# Patient Record
Sex: Male | Born: 1955 | Race: White | Hispanic: No | Marital: Married
Health system: Southern US, Community
[De-identification: ages and names within clinical notes are randomized; demographics above are authoritative.]

---

## 2014-01-08 ENCOUNTER — Other Ambulatory Visit: Payer: Self-pay | Admitting: Specialist

## 2014-01-08 DIAGNOSIS — M549 Dorsalgia, unspecified: Secondary | ICD-10-CM

## 2014-01-08 DIAGNOSIS — M542 Cervicalgia: Secondary | ICD-10-CM

## 2014-01-16 ENCOUNTER — Ambulatory Visit
Admission: RE | Admit: 2014-01-16 | Discharge: 2014-01-16 | Disposition: A | Discharge status: Home / Self Care | Payer: Non-veteran care | Source: Ambulatory Visit | Attending: Specialist | Admitting: Specialist

## 2014-01-16 ENCOUNTER — Inpatient Hospital Stay
Admission: RE | Admit: 2014-01-16 | Discharge: 2014-01-16 | Disposition: A | Discharge status: Home / Self Care | Payer: Self-pay | Source: Ambulatory Visit | Attending: Specialist | Admitting: Specialist

## 2014-01-16 ENCOUNTER — Other Ambulatory Visit: Payer: Self-pay | Admitting: Specialist

## 2014-01-16 VITALS — BP 142/82 | HR 58

## 2014-01-16 DIAGNOSIS — M549 Dorsalgia, unspecified: Principal | ICD-10-CM

## 2014-01-16 DIAGNOSIS — G8929 Other chronic pain: Principal | ICD-10-CM

## 2014-01-16 DIAGNOSIS — M542 Cervicalgia: Secondary | ICD-10-CM

## 2014-01-16 MED ORDER — DIAZEPAM 5 MG PO TABS
10.0000 mg | ORAL_TABLET | Freq: Once | ORAL | Status: AC
  Administered 2014-01-16: 10 mg via ORAL

## 2014-01-16 MED ORDER — IOHEXOL 300 MG/ML  SOLN: 10.0000 mL | Freq: Once | INTRAMUSCULAR | Status: DC | PRN

## 2014-01-16 NOTE — Discharge Instructions (Signed)
Myelogram Discharge Instructions  1. Go home and rest quietly for the next 24 hours.  It is important to lie flat for the next 24 hours.  Get up only to go to the restroom.  You may lie in the bed or on a couch on your back, your stomach, your left side or your right side.  You may have one pillow under your head.  You may have pillows between your knees while you are on your side or under your knees while you are on your back.  2. DO NOT drive today.  Recline the seat as far back as it will go, while still wearing your seat belt, on the way home.  3. You may get up to go to the bathroom as needed.  You may sit up for 10 minutes to eat.  You may resume your normal diet and medications unless otherwise indicated.  Drink lots of extra fluids today and tomorrow.  4. The incidence of headache, nausea, or vomiting is about 5% (one in 20 patients).  If you develop a headache, lie flat and drink plenty of fluids until the headache goes away.  Caffeinated beverages may be helpful.  If you develop severe nausea and vomiting or a headache that does not go away with flat bed rest, call [phone].  5. You may resume normal activities after your 24 hours of bed rest is over; however, do not exert yourself strongly or do any heavy lifting tomorrow. If when you get up you have a headache when standing, go back to bed and force fluids for another 24 hours.  6. Call your physician for a follow-up appointment.  The results of your myelogram will be sent directly to your physician by the following day.  7. If you have any questions or if complications develop after you arrive home, please call [phone].  Discharge instructions have been explained to the patient.  The patient, or the person responsible for the patient, fully understands these instructions.      May resume Sertraline on Jan 17, 2014, after 8:30 am.

## 2014-01-16 NOTE — Progress Notes (Signed)
Pt has been off sertraline since Wed. Discharge instructions explained to pt.

## 2015-03-10 IMAGING — RF DG MYELOGRAPHY LUMBAR INJ MULTI REGION
11 of 24 series · 11 of 24 positions shown · non-contrast
Comparison: MRI cervical spine 03/21/2012

CLINICAL DATA: Low back and right leg pain. Neck and right arm
pain.
TECHNIQUE: Contiguous axial images were obtained through the Cervical and
Lumbar spine after the intrathecal infusion of infusion. Coronal and
sagittal reconstructions were obtained of the axial image sets.

[Series 2: (hospital) · 1 of 1 slices shown]
[im 1/1]
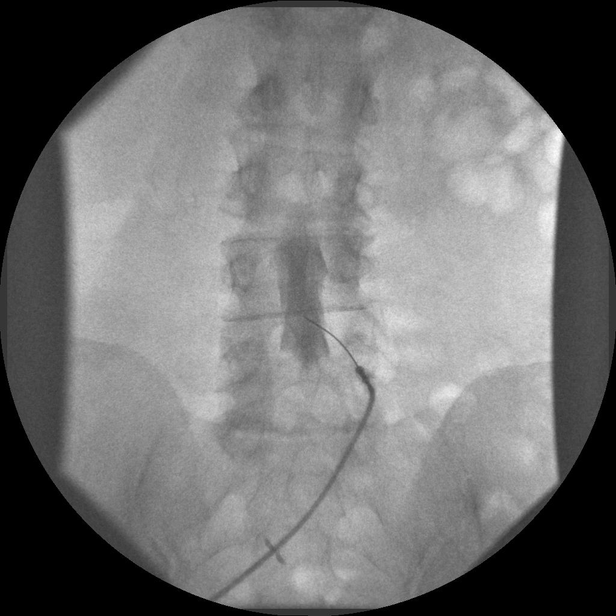

[Series 4: myelogram  white · 1 of 1 slices shown (1 of 10)]
[im 1/1]
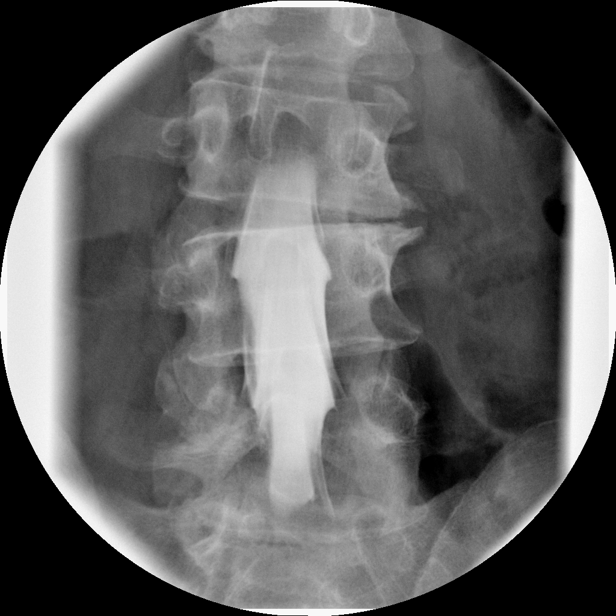

[Series 6: myelogram  white · 1 of 1 slices shown (2 of 10)]
[im 1/1]
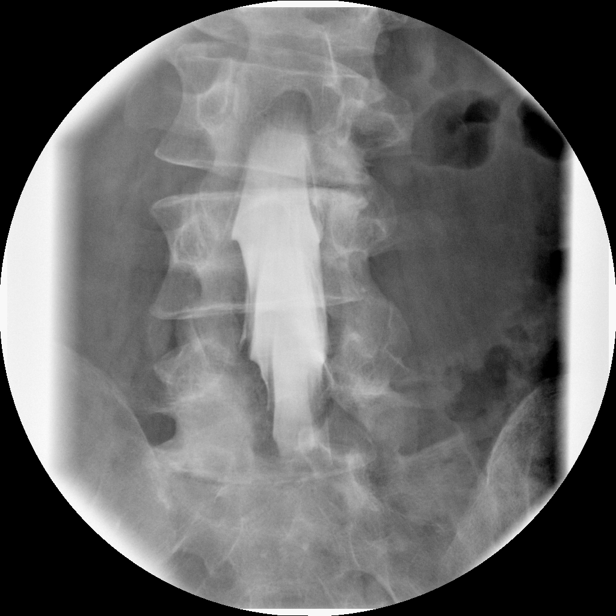

[Series 8: myelogram  white · 1 of 1 slices shown (3 of 10)]
[im 1/1]
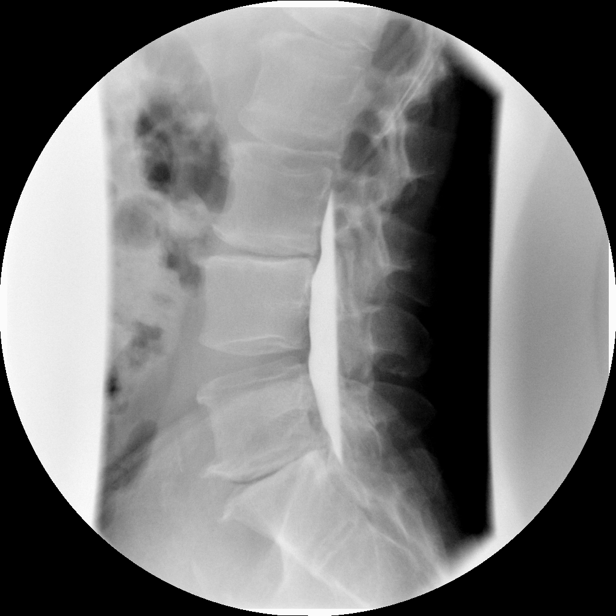

[Series 10: myelogram  white · 1 of 1 slices shown (4 of 10)]
[im 1/1]
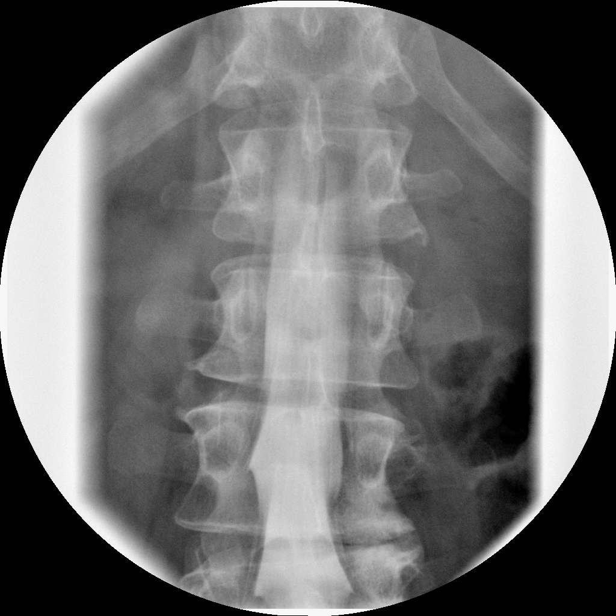

[Series 13: myelogram  white · 1 of 1 slices shown (5 of 10)]
[im 1/1]
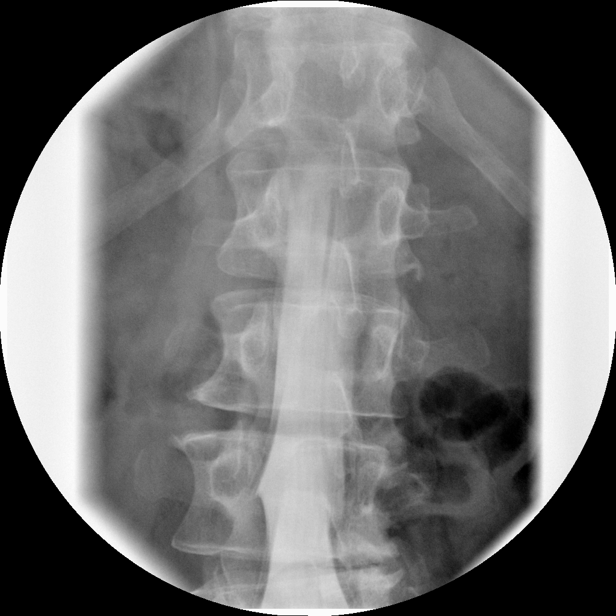

[Series 15: myelogram  white · 1 of 1 slices shown (6 of 10)]
[im 1/1]
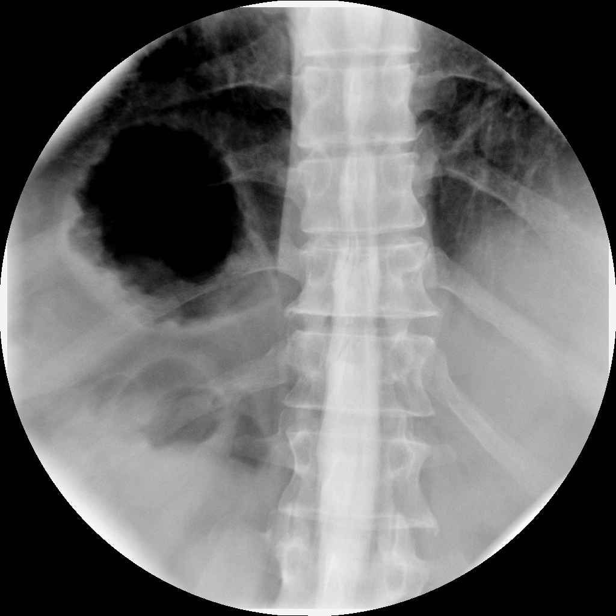

[Series 17: myelogram  white · 1 of 1 slices shown (7 of 10)]
[im 1/1]
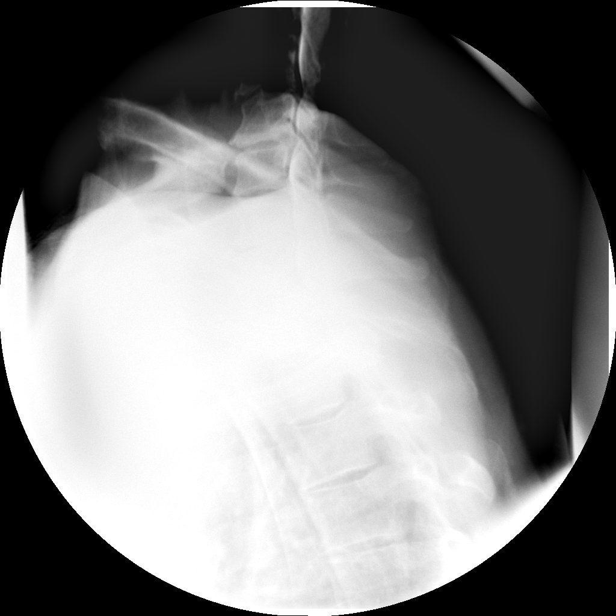

[Series 19: myelogram  white · 1 of 1 slices shown (8 of 10)]
[im 1/1]
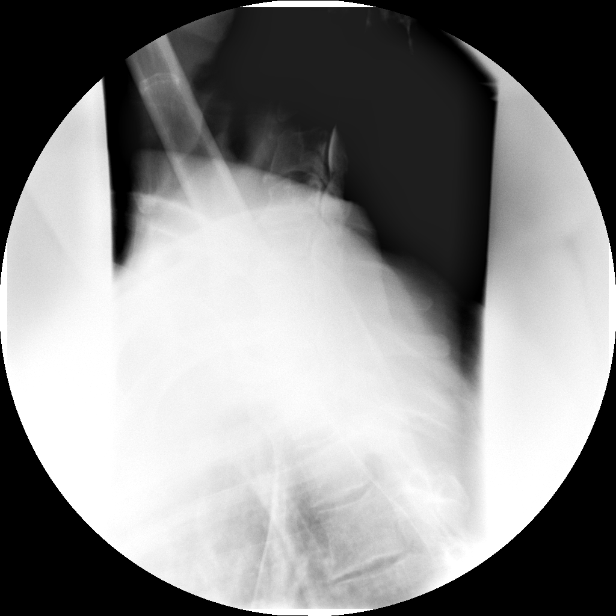

[Series 21: myelogram  white · 1 of 1 slices shown (9 of 10)]
[im 1/1]
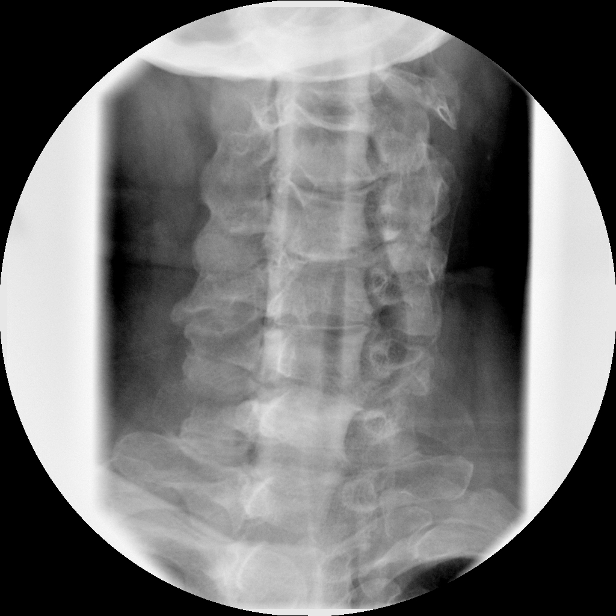

[Series 23: myelogram  white · 1 of 1 slices shown (10 of 10)]
[im 1/1]
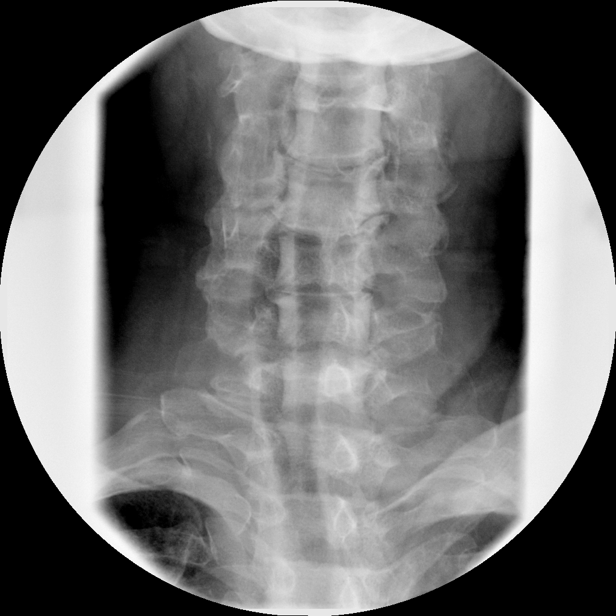

[11 of 24 positions shown; findings below may reference images not displayed]

FLUOROSCOPY TIME:  1 min and 35 seconds

PROCEDURE:
LUMBAR PUNCTURE FOR CERVICAL AND LUMBAR MYELOGRAM

CERVICAL AND LUMBAR MYELOGRAM

CT CERVICAL MYELOGRAM

CT LUMBAR MYELOGRAM

After thorough discussion of risks and benefits of the procedure
including bleeding, infection, injury to nerves, blood vessels,
adjacent structures as well as headache and CSF leak, written and
oral informed consent was obtained. Consent was obtained by Dr. Ikram Ahmed
[place].

Patient was positioned prone on the fluoroscopy table. Local
anesthesia was provided with 1% lidocaine without epinephrine after
prepped and draped in the usual sterile fashion. Puncture was
performed at L4-5 using a 3 1/2 inch 22-gauge spinal needle via
midline approach. Using a single pass through the dura, the needle
was placed within the thecal sac, with return of clear CSF. 10 mL of
[id number] was injected into the thecal sac, with normal
opacification of the nerve roots and cauda equina consistent with
free flow within the subarachnoid space. The patient was then moved
to the trendelenburg position and contrast flowed into the Cervical
spine region.

I personally performed the lumbar puncture and administered the
intrathecal contrast. I also personally supervised acquisition of
the myelogram images.
FINDINGS: CERVICAL AND LUMBAR MYELOGRAM FINDINGS:

CERVICAL: Good opacification cervical subarachnoid space. Severe
disc space narrowing at C4-5 and C6-7. Moderate disc space narrowing
at C3-C4. Cervical stenosis at C3-4 and particularly at C4-5. Trace
retrolisthesis C4-C5. No tonsillar herniation. No intraspinal
masses. Mild right C5 nerve root cut off. Moderate left C7 nerve
root cut off.

LUMBAR: Severe disc space narrowing L5-S1. Moderate disc space
narrowing at L3-4 much worse on the right. Degenerative scoliosis
convex left centered at the L3-4 interspace. Mild disc space
narrowing L2-L3. Anatomic alignment. No L5 or S1 nerve root
encroachment myelographically. Right L4 nerve root encroachment is
seen at the L3-4 level.

Standing flexion extension views demonstrate trace retrolisthesis
L3-4 without dynamic instability. Anatomic alignment without
significant movement at the other levels.

CT CERVICAL MYELOGRAM FINDINGS:

The individual disc spaces were examined as follows:

C2-3: Intervertebral disc space appears normal. No pannus at C1-C2.
Slight left facet arthropathy without foraminal narrowing.

C3-4: Calcified central protrusion. Bilateral uncinate spurring is
worse on the right. Right-sided facet arthropathy is noted. Right
greater than left C4 nerve root impingement. Mild canal stenosis is
more impressive on myelography. No frank cord compression.

C4-5: Trace retrolisthesis. Mild to moderate stenosis with slight
cord flattening. Canal diameter 7 mm. Severe disc space narrowing.
Calcified central protrusion. Right greater than left uncinate
spurring. Vacuum phenomenon in the foramen transversarium on the
right image 41 series 6, suggests far lateral disc material. Right
greater than left C5 nerve root encroachment.

C5-6: Slight truncation left C6 nerve root related to asymmetric
facet arthropathy and uncinate spurring or mild nature. No stenosis.
Borderline left C6 nerve root impingement.

C6-7: Central and leftward protrusion is partially calcified with
large uncinate spur. Mild canal stenosis on the left with slight
cord flattening, measuring approximately 7 mm. Mild facet
arthropathy. Severe left C7 nerve root impingement.

C7-T1:  Unremarkable.

No neck masses.  Carotid atherosclerosis.  No lung apex lesion.

CT LUMBAR MYELOGRAM FINDINGS:

No worrisome osseous lesions. Normal conus. Anatomic alignment. No
paravertebral masses. Atheromatous change of the aorta, non
aneurysmal.

The individual disc spaces were examined with axial images as
follows:

L1-L2:  Normal.

L2-L3: Far lateral protrusion on the left is partially calcified.
This extends into the foramen and beyond. Left L2 and left L3 nerve
root impingement.

L3-L4: Partially calcified central and leftward protrusion. Marked
asymmetric loss of interspace height on the right. Osteophytic
spurring extends into the paravertebral soft tissues on the right.
No L4 nerve root impingement in the canal but the right greater than
left L3 nerve roots are likely compressed in the foramina and
beyond.

L4-L5: Mild bulge. Calcified foraminal protrusion on the right. Mild
facet arthropathy. No L5 nerve root impingement in the canal but the
right L4 nerve root could be compressed in the foramen.

L5-S1: Severe disc space narrowing. Calcified central protrusion
extends into both neural foramina which are narrowed through loss of
interspace height, osteophytic spurring, and bony facet overgrowth.
Severe left and equivocal right L5 nerve root impingement.
IMPRESSION: Mild stenosis C4-C5 in the cervical region. Far lateral protrusion
in the right foramen transverse area [REDACTED]on the right
potentially affecting the vertebral artery or right C5 nerve root.

Mild stenosis at C3-C4 with calcified central protrusion and
rightward foraminal narrowing due to uncinate spurring.

Central and leftward protrusion at C6-C7 results in severe left C7
nerve root impingement.

Dominant right-sided abnormality of the lumbar spine is at L3-4
where there is asymmetric right sided disc space narrowing and bony
overgrowth affecting the right L3 nerve root primarily.

Calcified foraminal protrusion at L4-5 on the right could also be
symptomatic.

No dynamic instability and lumbar spine.
